# Patient Record
Sex: Male | Born: 1985 | Race: White | Hispanic: No | Marital: Married | Smoking: Never smoker
Health system: Southern US, Community
[De-identification: ages and names within clinical notes are randomized; demographics above are authoritative.]

---

## 2008-04-29 IMAGING — CR DG CHEST 1V
1 series · 1 of 1 positions shown · non-contrast
Comparison: NONE

CLINICAL DATA: Pre-employment. 

CHEST - SINGLE VIEW (PA)

[view not recorded]
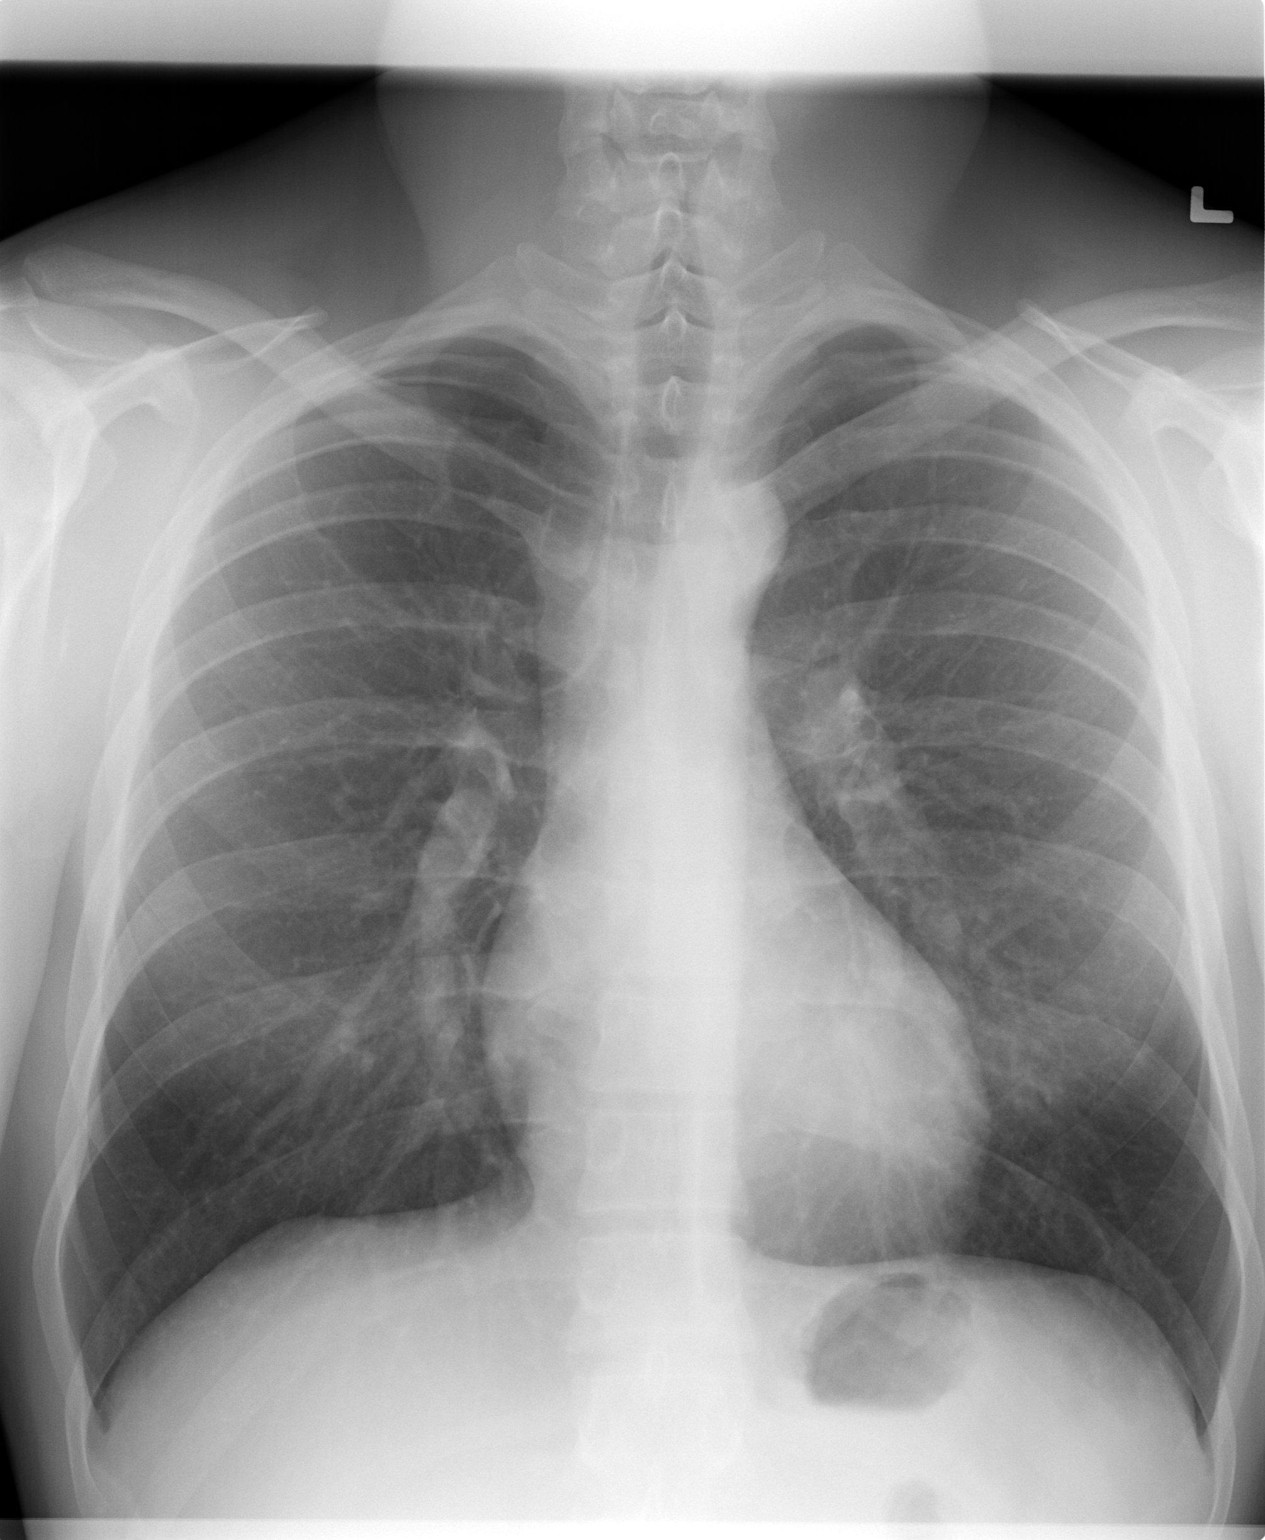

[1 of 1 positions shown; findings below may reference images not displayed]

FINDINGS: The lungs are clear and well expanded.   Heart and 
pulmonary vessels are normal.  No significant abnormalities are 
noted in the regional skeleton. 

electronically reviewed on 09/29/2006 Dict Date: 09/28/2006  Tran 
Date: 09/28/2006 DAS  JEC

## 2016-08-11 ENCOUNTER — Ambulatory Visit (INDEPENDENT_AMBULATORY_CARE_PROVIDER_SITE_OTHER): Payer: Commercial Managed Care - HMO | Admitting: Sports Medicine

## 2016-08-11 ENCOUNTER — Ambulatory Visit: Payer: Self-pay

## 2016-08-11 VITALS — BP 134/82 | HR 79 | Ht 71.0 in | Wt 189.0 lb

## 2016-08-11 DIAGNOSIS — M25521 Pain in right elbow: Secondary | ICD-10-CM | POA: Diagnosis not present

## 2016-08-11 DIAGNOSIS — M7701 Medial epicondylitis, right elbow: Secondary | ICD-10-CM

## 2016-08-11 DIAGNOSIS — G8929 Other chronic pain: Secondary | ICD-10-CM | POA: Diagnosis not present

## 2016-08-11 MED ORDER — NITROGLYCERIN 0.2 MG/HR TD PT24: MEDICATED_PATCH | TRANSDERMAL | 1 refills | Status: DC

## 2016-08-11 NOTE — Patient Instructions (Addendum)
Nitroglycerin Protocol   Apply 1/4 nitroglycerin patch to affected area daily.  Change position of patch within the affected area every 24 hours.  You may experience a headache during the first 1-2 weeks of using the patch, these should subside.  If you experience headaches after beginning nitroglycerin patch treatment, you may take your preferred over the counter pain reliever.  Another side effect of the nitroglycerin patch is skin irritation or rash related to patch adhesive.  Please notify our office if you develop more severe headaches or rash, and stop the patch.  Tendon healing with nitroglycerin patch may require 12 to 24 weeks depending on the extent of injury.  Men should not use if taking Viagra, Cialis, or Levitra.   Do not use if you have migraines or rosacea.    Please perform the exercise program that Fayrene FearingJames has prepared for you and gone over in detail on a daily basis.  In addition to the handout you were provided you can access your program through: [email or web address]   Your unique program code is: [email or web address]

## 2016-08-11 NOTE — Progress Notes (Signed)
OFFICE VISIT NOTE Brandon Mcdonald. [name] [place] at [facility] [phone]  Brandon Mcdonald - 31 y.o. male MRN [id number]  Date of birth: 11-19-1985  Visit Date: 08/11/2016  PCP: No primary care provider on file.   Referred by: No ref. provider found  Loyola Mast, CMA acting as scribe for Dr. Berline Chough.  SUBJECTIVE:   Chief Complaint  Patient presents with  . pain in elbow   HPI: As below and per problem based documentation when appropriate.  Pt presents today for right elbow pain Pain started about 2 months ago No known injury of trauma  The pain is described as constant nagging  and is rated as 5/10.  Worsened with pulling and pushing, twisting Improves with resting the arm Therapies tried include: none  Other associated symptoms include: none  Pt denies fever, chills, night sweats    Review of Systems  Constitutional: Negative.   HENT: Negative.   Eyes: Negative.   Respiratory: Negative.   Cardiovascular: Negative.   Gastrointestinal: Negative.   Genitourinary: Negative.   Musculoskeletal: Positive for joint pain. Negative for falls.  Skin: Negative.   Neurological: Negative.   Endo/Heme/Allergies: Negative.     Otherwise per HPI.  HISTORY & PERTINENT PRIOR DATA:  No specialty comments available. He reports that he has never smoked. He has never used smokeless tobacco. No results for input(s): HGBA1C, LABURIC in the last 8760 hours. Medications & Allergies reviewed per EMR There are no active problems to display for this patient.  No past medical history on file. No family history on file. No past surgical history on file. Social History   Occupational History  . Not on file.   Social History Main Topics  . Smoking status: Never Smoker  . Smokeless tobacco: Never Used  . Alcohol use No  . Drug use: No  . Sexual activity: Yes    Partners: Female    OBJECTIVE:  VS:  HT:5\' 11"  (180.3 cm)   WT:189 lb (85.7  kg)  BMI:26.4    BP:134/82  HR:79bpm  TEMP: ( )  RESP:98 % EXAM: Findings:  WDWN, NAD, Non-toxic appearing Alert & appropriately interactive Not depressed or anxious appearing No increased work of breathing. Pupils are equal. EOM intact without nystagmus No clubbing or cyanosis of the extremities appreciated No significant rashes/lesions/ulcerations overlying the examined area. Radial pulses 2+/4.  No significant generalized UE edema. Sensation intact to light touch in upper extremities.  Right elbow: Overall well aligned.  Muscular.  No tenderness along the extensor tendons or lateral epicondyle.  Focal TTP over the insertion of the medial flexor tendons on the medial epicondyle.  No pain with palpation over the cubital tunnel.  He has worsening pain with radial deviated wrist flexion against resistance.  Strength is 5 out of 5 diffusely.  He has no pain with wrist extension.  No pain with third finger extension.   ++++++++++++++++++++++++++++++++++++++++++++++++++++++++++++++++++ LIMITED MSK ULTRASOUND OF right elbow Images were obtained and interpreted by myself, [name], DO  Images obtained on: GE S7 Ultrasound machine Machine was off-line at the time of exam.  No charge for today's ultrasound since we will not be able to sink it to PACS.  FINDINGS:  Medial epicondyle has early findings of calcification of the flexor tendon bundle at the insertion of the medial aspect fibers.  There is hypoechoic change within the tendon and only minimal neovascularity.  No significant split appreciated.  This is directly over the  area of maximal tenderness.  IMPRESSION:  Medial epicondylosis     No results found. ASSESSMENT & PLAN:   Problem List Items Addressed This Visit    Elbow pain, chronic, right   Relevant Medications   nitroGLYCERIN (NITRODUR - DOSED IN MG/24 HR) 0.2 mg/hr patch    Other Visit Diagnoses    Golfers elbow of right upper extremity    -  Primary   Relevant  Orders   US LIMITED JOINT SPACE STRUCTURES UP RIGHT(NO LINKED CHARGES)      Follow-up: Return in about 6 weeks (around 09/22/2016) for repeat diagnostic ultrasound.   CMA/ATC served as Neurosurgeonscribe during this visit. History, Physical, and Plan performed by medical provider. Documentation and orders reviewed and attested to.      Gaspar BiddingMichael Camira Geidel, DO    [facility]

## 2016-08-11 NOTE — Assessment & Plan Note (Signed)
Symptoms are consistent with medial epicondylosis. Nitroglycerin protocol Medial off loader strap discussed the purchase over-the-counter. Therapeutic exercises reviewed with focus on eccentric phase of flexor tendons.  +++++++++++++++++++++++++++++++++++++++++++++++++++++++++++++++ PROCEDURE NOTE: THERAPEUTIC EXERCISES (97110) 15 minutes spent for Therapeutic exercises as stated in above notes.  This included exercises focusing on stretching, strengthening, with significant focus on eccentric aspects.   Proper technique shown and discussed handout in great detail with ATC.  All questions were discussed and answered.

## 2016-09-22 ENCOUNTER — Ambulatory Visit: Payer: Self-pay

## 2016-09-22 ENCOUNTER — Encounter: Payer: Self-pay | Admitting: Sports Medicine

## 2016-09-22 ENCOUNTER — Ambulatory Visit (INDEPENDENT_AMBULATORY_CARE_PROVIDER_SITE_OTHER): Payer: 59 | Admitting: Sports Medicine

## 2016-09-22 VITALS — BP 136/84 | HR 74 | Ht 71.0 in | Wt 187.2 lb

## 2016-09-22 DIAGNOSIS — G8929 Other chronic pain: Secondary | ICD-10-CM | POA: Diagnosis not present

## 2016-09-22 DIAGNOSIS — M25521 Pain in right elbow: Secondary | ICD-10-CM

## 2016-09-22 DIAGNOSIS — M7701 Medial epicondylitis, right elbow: Secondary | ICD-10-CM

## 2016-09-22 NOTE — Assessment & Plan Note (Addendum)
Symptoms and ultrasound have improved.  I would like for him to continue with the nitroglycerin patches for an additional 6 weeks as well as emphasized the importance of the eccentric exercises.  In 6 weeks if he is asymptomatic he can discontinue the patches and follow-up as needed otherwise we will plan to see him back and repeat MSK ultrasound for consideration of continuation versus discussion around PRP.  ++++++++++++++++++++++++++++++++++++++++++++++++++++++++++++++++++ LIMITED MSK ULTRASOUND OF right elbow Images were obtained and interpreted by myself, Gaspar BiddingMichael Melvenia Favela, DO  Images have been saved and stored to PACS system. Images obtained on: GE S7 Ultrasound machine  FINDINGS:   Common flexor tendon is overall intact with a small hypoechoic far lateral and inferior portion with increased neovascularity compared to last visit.  Overall the fibrillar structure is normal appearing.  IMPRESSION:  1. Improving medial epicondylosis

## 2016-09-22 NOTE — Progress Notes (Signed)
OFFICE VISIT NOTE Veverly FellsMichael D. Delorise Shinerigby, DO  [facility] [place] [phone]  Charma IgoBradley P Krog - 31 y.o. male MRN [id number]  Date of birth: 02/15/1986  Visit Date: 09/22/2016  PCP: No primary care provider on file.   Referred by: No ref. provider found  Orlie DakinBrandy Shelton, CMA acting as scribe for Dr. Berline Choughigby.  SUBJECTIVE:   Chief Complaint  Patient presents with  . Follow-up    elbow pain   HPI: As below and per problem based documentation when appropriate.  Pt presents today in follow-up of right elbow pain, repeat ultrasound. Pain started about 3 months ago. No known injury or trauma. Pt had u/s done 08/11/16 which showed medial epicondylitis/golfers elbow. He was advised to follow Nitro Protocol and repeat u/s 6 weeks later.    Pt reports improvement in elbow pain. He has been following Nitro Protocol. Pain is currently 3/10. He has no pain at rest but still has some discomfort when twisting the arm. Pt is not taking any oral medications for the pain, no heat or ice.   Pt denies fever, chills, night sweats.     Review of Systems  Constitutional: Negative for chills and fever.  Respiratory: Negative for shortness of breath and wheezing.   Cardiovascular: Negative for chest pain and palpitations.  Musculoskeletal: Negative for falls.  Neurological: Negative for dizziness, tingling and headaches.  Endo/Heme/Allergies: Does not bruise/bleed easily.    Otherwise per HPI.  HISTORY & PERTINENT PRIOR DATA:  No specialty comments available. He reports that he has never smoked. He has never used smokeless tobacco. No results for input(s): HGBA1C, LABURIC in the last 8760 hours. Medications & Allergies reviewed per EMR Patient Active Problem List   Diagnosis Date Noted  . Golfer's elbow, right 09/22/2016  . Elbow pain, chronic, right 08/11/2016   No past medical history on file. No family history on file. No past surgical history on  file. Social History   Occupational History  . Not on file.   Social History Main Topics  . Smoking status: Never Smoker  . Smokeless tobacco: Never Used  . Alcohol use No  . Drug use: No  . Sexual activity: Yes    Partners: Female    OBJECTIVE:  VS:  HT:5\' 11"  (180.3 cm)   WT:187 lb 3.2 oz (84.9 kg)  BMI:26.2    BP:136/84  HR:74bpm  TEMP: ( )  RESP:98 % EXAM: Findings:  Right elbow is overall well aligned.  He does have prominence of the right medial epicondyles consistent with prior overhead athlete.  He has only minimal discomfort with palpation of the flexor tendons.  His elbow is ligamentously stable.  He has full flexion and extension.  Minimal pain with wrist flexion.      No results found. ASSESSMENT & PLAN:   Problem List Items Addressed This Visit    Elbow pain, chronic, right - Primary   Relevant Orders   US LIMITED JOINT SPACE STRUCTURES UP RIGHT(NO LINKED CHARGES)   Golfer's elbow, right    Symptoms and ultrasound have improved.  I would like for him to continue with the nitroglycerin patches for an additional 6 weeks as well as emphasized the importance of the eccentric exercises.  In 6 weeks if he is asymptomatic he can discontinue the patches and follow-up as needed otherwise we will plan to see him back and repeat MSK ultrasound for consideration of continuation versus discussion around PRP.  ++++++++++++++++++++++++++++++++++++++++++++++++++++++++++++++++++ LIMITED MSK ULTRASOUND OF  right elbow Images were obtained and interpreted by myself, [name], DO  Images have been saved and stored to PACS system. Images obtained on: GE S7 Ultrasound machine  FINDINGS:   Common flexor tendon is overall intact with a small hypoechoic far lateral and inferior portion with increased neovascularity compared to last visit.  Overall the fibrillar structure is normal appearing.  IMPRESSION:  1. Improving medial epicondylosis        Relevant Orders   [place]  LIMITED JOINT SPACE STRUCTURES UP RIGHT(NO LINKED CHARGES)      Follow-up: Return in about 6 weeks (around 11/03/2016).  No future appointments.   CMA/ATC served as Neurosurgeon during this visit. History, Physical, and Plan performed by medical provider. Documentation and orders reviewed and attested to.      [name], DO    [facility]

## 2019-03-21 ENCOUNTER — Ambulatory Visit: Payer: Commercial Managed Care - HMO | Attending: Internal Medicine

## 2019-03-21 DIAGNOSIS — Z20822 Contact with and (suspected) exposure to covid-19: Secondary | ICD-10-CM

## 2019-03-22 LAB — NOVEL CORONAVIRUS, NAA: SARS-CoV-2, NAA: NOT DETECTED

## 2020-02-23 ENCOUNTER — Ambulatory Visit: Payer: Commercial Managed Care - HMO | Attending: Internal Medicine

## 2020-02-23 DIAGNOSIS — Z23 Encounter for immunization: Secondary | ICD-10-CM

## 2020-02-23 NOTE — Progress Notes (Signed)
   Covid-19 Vaccination Clinic  Name:  Brandon Mcdonald    MRN: [id number] DOB: February 22, 1986  02/23/2020  [name] was observed post Covid-19 immunization for 15 minutes without incident. He was provided with Vaccine Information Sheet and instruction to access the V-Safe system.   [name] was instructed to call 911 with any severe reactions post vaccine: Marland Kitchen Difficulty breathing  . Swelling of face and throat  . A fast heartbeat  . A bad rash all over body  . Dizziness and weakness   Immunizations Administered    No immunizations on file.

## 2023-09-27 ENCOUNTER — Telehealth: Payer: Self-pay

## 2023-09-27 NOTE — Telephone Encounter (Signed)
 Patients wife is questioning if her husband can establish care with you? Wife is a current patient.

## 2023-09-27 NOTE — Telephone Encounter (Signed)
 Copied from CRM [id number]. Topic: Appointments - Scheduling Inquiry for Clinic >> Sep 27, 2023  1:39 PM Ernestene P wrote:   Reason for CRM: Pt wife is trying to schedule pt for new pt appointment with dr.Tabori as wife is a patient of hers (wife name is michelle Ketelsen DOB 02/07/85) pt can be reached for scheduling [phone]

## 2023-09-27 NOTE — Telephone Encounter (Signed)
 Ok to establish care but this will be after I return (earliest would be August)

## 2023-09-28 NOTE — Telephone Encounter (Signed)
 Scheduled for 12/02/23

## 2023-10-22 HISTORY — PX: VASECTOMY: SHX75

## 2023-12-02 ENCOUNTER — Ambulatory Visit: Admitting: Family Medicine

## 2023-12-02 VITALS — BP 142/82 | HR 78 | Temp 97.9°F | Ht 71.0 in | Wt 183.2 lb

## 2023-12-02 DIAGNOSIS — Z0000 Encounter for general adult medical examination without abnormal findings: Secondary | ICD-10-CM | POA: Diagnosis not present

## 2023-12-02 DIAGNOSIS — Z23 Encounter for immunization: Secondary | ICD-10-CM

## 2023-12-02 NOTE — Progress Notes (Signed)
   Subjective:    Patient ID: Brandon Mcdonald, male    DOB: 10/06/1985, 38 y.o.   MRN: [id number]  HPI New to establish.  No recent PCP- last saw Dr Sophronia  CPE- no concerns today.  Due for Tdap. Had labs done for occupational health.   Review of Systems Patient reports no vision/hearing changes, anorexia, fever ,adenopathy, persistant/recurrent hoarseness, swallowing issues, chest pain, palpitations, edema, persistant/recurrent cough, hemoptysis, dyspnea (rest,exertional, paroxysmal nocturnal), gastrointestinal  bleeding (melena, rectal bleeding), abdominal pain, excessive heart burn, GU symptoms (dysuria, hematuria, voiding/incontinence issues) syncope, focal weakness, memory loss, numbness & tingling, skin/hair/nail changes, depression, anxiety, abnormal bruising/bleeding, musculoskeletal symptoms/signs.     Objective:   Physical Exam General Appearance:    Alert, cooperative, no distress, appears stated age  Head:    Normocephalic, without obvious abnormality, atraumatic  Eyes:    PERRL, conjunctiva/corneas clear, EOM's intact both eyes       Ears:    Normal TM's and external ear canals, both ears  Nose:   Nares normal, septum midline, mucosa normal, no drainage   or sinus tenderness  Throat:   Lips, mucosa, and tongue normal; teeth and gums normal  Neck:   Supple, symmetrical, trachea midline, no adenopathy;       thyroid:  No enlargement/tenderness/nodules  Back:     Symmetric, no curvature, ROM normal, no CVA tenderness  Lungs:     Clear to auscultation bilaterally, respirations unlabored  Chest wall:    No tenderness or deformity  Heart:    Regular rate and rhythm, S1 and S2 normal, no murmur, rub   or gallop  Abdomen:     Soft, non-tender, bowel sounds active all four quadrants,    no masses, no organomegaly  Genitalia:    deferred  Rectal:    Extremities:   Extremities normal, atraumatic, no cyanosis or edema  Pulses:   2+ and symmetric all extremities  Skin:   Skin color,  texture, turgor normal, no rashes or lesions  Lymph nodes:   Cervical, supraclavicular, and axillary nodes normal  Neurologic:   CNII-XII intact. Normal strength, sensation and reflexes      throughout          Assessment & Plan:

## 2023-12-02 NOTE — Patient Instructions (Addendum)
 Follow up in 1 year or as needed Your labs look great!!!  No need to repeat at this time Keep up the good work on healthy diet and regular exercise- you look great! The tetanus shot is good for 10 years Call with any questions or concerns Stay Safe!  Stay Healthy! Welcome!  We're glad to have you!!!

## 2023-12-21 DIAGNOSIS — Z0000 Encounter for general adult medical examination without abnormal findings: Secondary | ICD-10-CM | POA: Insufficient documentation

## 2023-12-21 NOTE — Assessment & Plan Note (Signed)
 Pt's PE WNL.  Reviewed recent labs.  No need to repeat.  Tdap updated.  Too young for colonoscopy.  Anticipatory guidance provided.

## 2024-04-28 ENCOUNTER — Other Ambulatory Visit (HOSPITAL_BASED_OUTPATIENT_CLINIC_OR_DEPARTMENT_OTHER): Payer: Self-pay | Admitting: Family Medicine

## 2024-04-28 DIAGNOSIS — Z8249 Family history of ischemic heart disease and other diseases of the circulatory system: Secondary | ICD-10-CM
